# Patient Record
Sex: Male | Born: 1975 | Race: White | Hispanic: Yes | Smoking: Never smoker
Health system: Southern US, Community
[De-identification: ages and names within clinical notes are randomized; demographics above are authoritative.]

## PROBLEM LIST (undated history)

## (undated) DIAGNOSIS — K219 Gastro-esophageal reflux disease without esophagitis: Secondary | ICD-10-CM

## (undated) DIAGNOSIS — I10 Essential (primary) hypertension: Secondary | ICD-10-CM

## (undated) DIAGNOSIS — A048 Other specified bacterial intestinal infections: Secondary | ICD-10-CM

## (undated) HISTORY — DX: Other specified bacterial intestinal infections: A04.8

## (undated) HISTORY — DX: Essential (primary) hypertension: I10

## (undated) HISTORY — DX: Gastro-esophageal reflux disease without esophagitis: K21.9

---

## 2017-11-21 ENCOUNTER — Encounter (HOSPITAL_COMMUNITY): Payer: Self-pay | Admitting: Emergency Medicine

## 2017-11-21 ENCOUNTER — Emergency Department (HOSPITAL_COMMUNITY): Payer: Self-pay

## 2017-11-21 ENCOUNTER — Emergency Department (HOSPITAL_COMMUNITY)
Admission: EM | Admit: 2017-11-21 | Discharge: 2017-11-21 | Disposition: A | Discharge status: Home / Self Care | Payer: Self-pay | Attending: Emergency Medicine | Admitting: Emergency Medicine

## 2017-11-21 ENCOUNTER — Other Ambulatory Visit: Payer: Self-pay

## 2017-11-21 DIAGNOSIS — R0789 Other chest pain: Secondary | ICD-10-CM | POA: Insufficient documentation

## 2017-11-21 DIAGNOSIS — Y9289 Other specified places as the place of occurrence of the external cause: Secondary | ICD-10-CM | POA: Insufficient documentation

## 2017-11-21 DIAGNOSIS — Y9389 Activity, other specified: Secondary | ICD-10-CM | POA: Insufficient documentation

## 2017-11-21 DIAGNOSIS — X500XXA Overexertion from strenuous movement or load, initial encounter: Secondary | ICD-10-CM | POA: Insufficient documentation

## 2017-11-21 DIAGNOSIS — Y999 Unspecified external cause status: Secondary | ICD-10-CM | POA: Insufficient documentation

## 2017-11-21 DIAGNOSIS — M549 Dorsalgia, unspecified: Secondary | ICD-10-CM | POA: Insufficient documentation

## 2017-11-21 LAB — CBC
HCT: 46.6 % (ref 39.0–52.0)
Hemoglobin: 16.1 g/dL (ref 13.0–17.0)
MCH: 30 pg (ref 26.0–34.0)
MCHC: 34.5 g/dL (ref 30.0–36.0)
MCV: 86.9 fL (ref 78.0–100.0)
PLATELETS: 290 10*3/uL (ref 150–400)
RBC: 5.36 MIL/uL (ref 4.22–5.81)
RDW: 13.6 % (ref 11.5–15.5)
WBC: 4.7 10*3/uL (ref 4.0–10.5)

## 2017-11-21 LAB — BASIC METABOLIC PANEL
Anion gap: 10 (ref 5–15)
BUN: 10 mg/dL (ref 6–20)
CALCIUM: 9.3 mg/dL (ref 8.9–10.3)
CHLORIDE: 103 mmol/L (ref 101–111)
CO2: 25 mmol/L (ref 22–32)
CREATININE: 0.86 mg/dL (ref 0.61–1.24)
GFR calc non Af Amer: >60 mL/min (ref >60)
Glucose, Bld: 133 mg/dL — ABNORMAL HIGH (ref 65–99)
Potassium: 3.7 mmol/L (ref 3.5–5.1)
SODIUM: 138 mmol/L (ref 135–145)

## 2017-11-21 LAB — D-DIMER, QUANTITATIVE (NOT AT ARMC): D DIMER QUANT: 0.27 ug{FEU}/mL (ref 0.00–0.50)

## 2017-11-21 LAB — I-STAT TROPONIN, ED
TROPONIN I, POC: 0 ng/mL (ref 0.00–0.08)
Troponin i, poc: 0 ng/mL (ref 0.00–0.08)

## 2017-11-21 MED ORDER — KETOROLAC TROMETHAMINE 60 MG/2ML IM SOLN
60.0000 mg | Freq: Once | INTRAMUSCULAR | Status: AC
  Administered 2017-11-21: 60 mg via INTRAMUSCULAR
  Filled 2017-11-21: qty 2

## 2017-11-21 MED ORDER — IBUPROFEN 800 MG PO TABS: 800.0000 mg | ORAL_TABLET | Freq: Three times a day (TID) | ORAL | 0 refills | Status: DC | PRN

## 2017-11-21 NOTE — ED Triage Notes (Signed)
Pt. Stated, Ive had chest pain and ack pain for 2 weeks.

## 2017-11-21 NOTE — ED Notes (Signed)
Hooked patient up to the monitor patient is resting with call bell in reach and family at bedside 

## 2017-11-21 NOTE — ED Provider Notes (Signed)
[facility] EMERGENCY DEPARTMENT Provider Note   CSN: [id number] Arrival date & time: 11/21/17  1025     History   Chief Complaint Chief Complaint  Patient presents with  . Chest Pain  . Back Pain    HPI Raymond Olson is a 42 y.o. male.  HPI Patient presents to the emergency department with chest pain is been ongoing for [redacted] weeks along with back pain.  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  Patient states that currently take any medications.  Patient states he has had no recent surgeries or immobilizations.  No recent prolonged travel.  Patient states nothing seems to make the condition better but certain movements and palpation make the pain worse.  Patient states that sharp in nature.  He states that the pain does not seem to get relief with any Tylenol or the patient states it does seem to be better this week than it was last week however.  Patient states the pain does not completely go away at any point.  The patient denies  shortness of breath, headache,blurred vision, neck pain, fever, cough, weakness, numbness, dizziness, anorexia, edema, abdominal pain, nausea, vomiting, diarrhea, rash, back pain, dysuria, hematemesis, bloody stool, near syncope, or syncope. History reviewed. No pertinent past medical history.  There are no active problems to display for this patient.   History reviewed. No pertinent surgical history.      Home Medications    Prior to Admission medications   Medication Sig Start Date End Date Taking? Authorizing Provider  loratadine (CLARITIN) 10 MG tablet Take 10 mg by mouth daily as needed for allergies.   Yes [provider]    Family History No family history on file.  Social History Social History   Tobacco Use  . Smoking status: Never  Smoker  . Smokeless tobacco: Never Used  Substance Use Topics  . Alcohol use: Yes  . Drug use: Not Currently     Allergies   Patient has no allergy information on record.   Review of Systems Review of Systems All other systems negative except as documented in the HPI. All pertinent positives and negatives as reviewed in the HPI.  Physical Exam Updated Vital Signs BP 118/82   Pulse (!) 52   Temp 98.7 F (37.1 C) (Oral)   Resp (!) 8   Ht  (1.651 m)   Wt 72.6 kg (160 lb)   SpO2 96%   BMI 26.63 kg/m   Physical Exam  Constitutional: He is oriented to person, place, and time. He appears well-developed and well-nourished. No distress.  HENT:  Head: Normocephalic and atraumatic.  Mouth/Throat: Oropharynx is clear and moist.  Eyes: Pupils are equal, round, and reactive to light.  Neck: Normal range of motion. Neck supple.  Cardiovascular: Normal rate, regular rhythm and normal heart sounds. Exam reveals no gallop and no friction rub.  No murmur heard. Pulmonary/Chest: Effort normal and breath sounds normal. No accessory muscle usage. No respiratory distress. He has no decreased breath sounds. He has no wheezes. He has no rhonchi. He has no rales.  Abdominal: Soft. Bowel sounds are normal. He exhibits no distension. There is no tenderness.  Neurological: He is alert and oriented to person, place, and time. He exhibits normal muscle tone. Coordination normal.  Skin: Skin  is warm and dry. Capillary refill takes less than 2 seconds. No rash noted. No erythema.  Psychiatric: He has a normal mood and affect. His behavior is normal.  Nursing note and vitals reviewed.    ED Treatments / Results  Labs (all labs ordered are listed, but only abnormal results are displayed) Labs Reviewed  BASIC METABOLIC PANEL - Abnormal; Notable for the following components:      Result Value   Glucose, Bld 133 (*)    All other components within normal limits  CBC  D-DIMER, QUANTITATIVE (NOT  AT [facility])  I-STAT TROPONIN, ED  I-STAT TROPONIN, ED    EKG EKG Interpretation  Date/Time:  Saturday Nov 21 2017 10:30:17 EDT Ventricular Rate:  68 PR Interval:  156 QRS Duration: 90 QT Interval:  398 QTC Calculation: 423 R Axis:   102 Text Interpretation:  Normal sinus rhythm with sinus arrhythmia Rightward axis nonspecific t waves inf and ant no prior to compare with Confirmed by Meridee Score [phone]) on 11/21/2017 12:21:11 PM   Radiology Dg Chest 2 View  Result Date: 11/21/2017 CLINICAL DATA:  Chest pain EXAM: CHEST - 2 VIEW COMPARISON:  None. FINDINGS: Normal heart size. Normal mediastinal contour. No pneumothorax. No pleural effusion. Lungs appear clear, with no acute consolidative airspace disease and no pulmonary edema. IMPRESSION: No active cardiopulmonary disease. Electronically Signed   By: Delbert Phenix M.D.   On: 11/21/2017 10:55    Procedures Procedures (including critical care time)  Medications Ordered in ED Medications  ketorolac (TORADOL) injection 60 mg (60 mg Intramuscular Given 11/21/17 1348)     Initial Impression / Assessment and Plan / ED Course  I have reviewed the triage vital signs and the nursing notes.  Pertinent labs & imaging results that were available during my care of the patient were reviewed by me and considered in my medical decision making (see chart for details).     Patient has atypical chest pain that has been constant for the last 2 weeks.  Patient does not have any exertional symptoms.  This pain was reproducible as well.  Advised the patient that he will need to follow-up with her primary doctor.  I advised him to return here for any worsening in his condition.  Final Clinical Impressions(s) / ED Diagnoses   Final diagnoses:  None    ED Discharge Orders    None       Charlestine Night, PA-C 11/21/17 1507    Terrilee Files, MD 11/23/17 [phone]

## 2017-11-21 NOTE — Discharge Instructions (Signed)
Your heart testing today did not show any abnormality.  We did do a test that showed no signs of blood clot.  I would like for you to follow-up with the clinic provided.

## 2019-07-30 ENCOUNTER — Emergency Department (HOSPITAL_COMMUNITY): Payer: Self-pay

## 2019-07-30 ENCOUNTER — Other Ambulatory Visit: Payer: Self-pay

## 2019-07-30 ENCOUNTER — Encounter (HOSPITAL_COMMUNITY): Payer: Self-pay | Admitting: Emergency Medicine

## 2019-07-30 ENCOUNTER — Emergency Department (HOSPITAL_COMMUNITY)
Admission: EM | Admit: 2019-07-30 | Discharge: 2019-07-30 | Disposition: A | Discharge status: Home / Self Care | Payer: Self-pay | Attending: Emergency Medicine | Admitting: Emergency Medicine

## 2019-07-30 DIAGNOSIS — R1084 Generalized abdominal pain: Secondary | ICD-10-CM | POA: Insufficient documentation

## 2019-07-30 DIAGNOSIS — R309 Painful micturition, unspecified: Secondary | ICD-10-CM | POA: Insufficient documentation

## 2019-07-30 DIAGNOSIS — R109 Unspecified abdominal pain: Secondary | ICD-10-CM

## 2019-07-30 DIAGNOSIS — K6289 Other specified diseases of anus and rectum: Secondary | ICD-10-CM | POA: Insufficient documentation

## 2019-07-30 DIAGNOSIS — K5900 Constipation, unspecified: Secondary | ICD-10-CM | POA: Insufficient documentation

## 2019-07-30 LAB — CBC
HCT: 50.2 % (ref 39.0–52.0)
Hemoglobin: 17.1 g/dL — ABNORMAL HIGH (ref 13.0–17.0)
MCH: 29.2 pg (ref 26.0–34.0)
MCHC: 34.1 g/dL (ref 30.0–36.0)
MCV: 85.7 fL (ref 80.0–100.0)
Platelets: 313 10*3/uL (ref 150–400)
RBC: 5.86 MIL/uL — ABNORMAL HIGH (ref 4.22–5.81)
RDW: 13.1 % (ref 11.5–15.5)
WBC: 6.4 10*3/uL (ref 4.0–10.5)
nRBC: 0 % (ref 0.0–0.2)

## 2019-07-30 LAB — COMPREHENSIVE METABOLIC PANEL
ALT: 81 U/L — ABNORMAL HIGH (ref 0–44)
AST: 38 U/L (ref 15–41)
Albumin: 4.6 g/dL (ref 3.5–5.0)
Alkaline Phosphatase: 60 U/L (ref 38–126)
Anion gap: 10 (ref 5–15)
BUN: 14 mg/dL (ref 6–20)
CO2: 29 mmol/L (ref 22–32)
Calcium: 9.5 mg/dL (ref 8.9–10.3)
Chloride: 102 mmol/L (ref 98–111)
Creatinine, Ser: 0.77 mg/dL (ref 0.61–1.24)
GFR calc Af Amer: >60 mL/min (ref >60)
GFR calc non Af Amer: >60 mL/min (ref >60)
Glucose, Bld: 110 mg/dL — ABNORMAL HIGH (ref 70–99)
Potassium: 4 mmol/L (ref 3.5–5.1)
Sodium: 141 mmol/L (ref 135–145)
Total Bilirubin: 1.4 mg/dL — ABNORMAL HIGH (ref 0.3–1.2)
Total Protein: 7.8 g/dL (ref 6.5–8.1)

## 2019-07-30 LAB — LIPASE, BLOOD: Lipase: 26 U/L (ref 11–51)

## 2019-07-30 LAB — URINALYSIS, ROUTINE W REFLEX MICROSCOPIC
Bilirubin Urine: NEGATIVE
Glucose, UA: NEGATIVE mg/dL
Hgb urine dipstick: NEGATIVE
Ketones, ur: NEGATIVE mg/dL
Leukocytes,Ua: NEGATIVE
Nitrite: NEGATIVE
Protein, ur: NEGATIVE mg/dL
Specific Gravity, Urine: 1.019 (ref 1.005–1.030)
pH: 7 (ref 5.0–8.0)

## 2019-07-30 MED ORDER — SODIUM CHLORIDE 0.9% FLUSH: 3.0000 mL | Freq: Once | INTRAVENOUS | Status: DC

## 2019-07-30 MED ORDER — IOHEXOL 300 MG/ML  SOLN
100.0000 mL | Freq: Once | INTRAMUSCULAR | Status: AC | PRN
  Administered 2019-07-30: 20:00:00 100 mL via INTRAVENOUS

## 2019-07-30 NOTE — ED Notes (Signed)
Patient transported to CT 

## 2019-07-30 NOTE — ED Provider Notes (Signed)
[facility] EMERGENCY DEPARTMENT Provider Note   CSN: [id number] Arrival date & time: 07/30/19  1604     History Chief Complaint  Patient presents with  . Abdominal Pain    Raymond Olson is a 44 y.o. male.  44yo M w/ PMH including H pylori who p/w abdominal pain. Pt states that he has had generalized, constant abdominal pain for ~2 weeks that seems to be worse when moving around but is not affected by eating.  He denies any associated vomiting or diarrhea.  He reports that he does have problems with constipation.  He states that over the past 3 to 4 days, he has been having some burning anal pain. It does not seem to be related to bowel movements. He endorses some burning with urination recently.   Patient notes that approximately 5 months ago, he was seen by his PCP for some abdominal pain issues.  He subsequently tested positive for H. pylori and was given medication treatment for it.  He reports that he has not had problems since then and reflux symptoms have been under control.  The history is provided by the patient.  Abdominal Pain      History reviewed. No pertinent past medical history.  There are no problems to display for this patient.   History reviewed. No pertinent surgical history.     No family history on file.  Social History   Tobacco Use  . Smoking status: Never Smoker  . Smokeless tobacco: Never Used  Substance Use Topics  . Alcohol use: Yes  . Drug use: Not Currently    Home Medications Prior to Admission medications   Medication Sig Start Date End Date Taking? Authorizing Provider  ibuprofen (ADVIL,MOTRIN) 800 MG tablet Take 1 tablet (800 mg total) by mouth every 8 (eight) hours as needed. 11/21/17   Lawyer, Cristal Deer, PA-C  loratadine (CLARITIN) 10 MG tablet Take 10 mg by mouth daily as needed for allergies.    [provider]    Allergies    Patient has no allergy information on record.  Review of  Systems   Review of Systems  Gastrointestinal: Positive for abdominal pain.   All other systems reviewed and are negative except that which was mentioned in HPI  Physical Exam Updated Vital Signs BP (!) 148/86   Pulse 70   Temp 98.2 F (36.8 C) (Oral)   Resp 16   SpO2 99%   Physical Exam Vitals and nursing note reviewed.  Constitutional:      General: He is not in acute distress.    Appearance: He is well-developed.  HENT:     Head: Normocephalic and atraumatic.  Eyes:     Conjunctiva/sclera: Conjunctivae normal.  Cardiovascular:     Rate and Rhythm: Normal rate and regular rhythm.     Heart sounds: Normal heart sounds. No murmur.  Pulmonary:     Effort: Pulmonary effort is normal.     Breath sounds: Normal breath sounds.  Abdominal:     General: Bowel sounds are normal. There is no distension.     Palpations: Abdomen is soft.     Tenderness: There is abdominal tenderness in the suprapubic area and left lower quadrant.  Musculoskeletal:     Cervical back: Neck supple.  Skin:    General: Skin is warm and dry.  Neurological:     Mental Status: He is alert and oriented to person, place, and time.     Comments: Fluent speech  Psychiatric:  Judgment: Judgment normal.     ED Results / Procedures / Treatments   Labs (all labs ordered are listed, but only abnormal results are displayed) Labs Reviewed  COMPREHENSIVE METABOLIC PANEL - Abnormal; Notable for the following components:      Result Value   Glucose, Bld 110 (*)    ALT 81 (*)    Total Bilirubin 1.4 (*)    All other components within normal limits  CBC - Abnormal; Notable for the following components:   RBC 5.86 (*)    Hemoglobin 17.1 (*)    All other components within normal limits  URINALYSIS, ROUTINE W REFLEX MICROSCOPIC - Abnormal; Notable for the following components:   APPearance HAZY (*)    All other components within normal limits  LIPASE, BLOOD    EKG None  Radiology CT Abdomen  Pelvis W Contrast  Result Date: 07/30/2019 CLINICAL DATA:  Left lower quadrant suprapubic pain EXAM: CT ABDOMEN AND PELVIS WITH CONTRAST TECHNIQUE: Multidetector CT imaging of the abdomen and pelvis was performed using the standard protocol following bolus administration of intravenous contrast. CONTRAST:  OMNIPAQUE IOHEXOL 300 MG/ML  SOLN COMPARISON:  None. FINDINGS: Lower chest: The visualized heart size within normal limits. No pericardial fluid/thickening. No hiatal hernia. The visualized portions of the lungs are clear. Hepatobiliary: There is diffuse low density seen throughout the liver parenchyma. No focal hepatic lesion is seen. No intra or extrahepatic biliary ductal dilatation. The main portal vein is patent. No evidence of calcified gallstones, gallbladder wall thickening or biliary dilatation. Pancreas: Unremarkable. No pancreatic ductal dilatation or surrounding inflammatory changes. Spleen: Normal in size without focal abnormality. Adrenals/Urinary Tract: Both adrenal glands appear normal. Small 4 mm renal calculus seen in the lower pole the right kidney. No hydronephrosis. No left-sided renal calculi seen. Bladder is unremarkable. Stomach/Bowel: The stomach, small bowel, and colon are normal in appearance. No inflammatory changes, wall thickening, or obstructive findings.Scattered colonic diverticula are noted without diverticulitis. The appendix is normal in appearance. Vascular/Lymphatic: There are no enlarged mesenteric, retroperitoneal, or pelvic lymph nodes. No significant vascular findings are present. Reproductive: The prostate is unremarkable. Other: No evidence of abdominal wall mass or hernia. Musculoskeletal: No acute or significant osseous findings. IMPRESSION: 1. Hepatic steatosis. 2. Diverticulosis without diverticulitis. 3. Nonobstructing 4 mm right renal calculi. Electronically Signed   By: Jonna Clark M.D.   On: 07/30/2019 20:27    Procedures Procedures (including  critical care time)  Medications Ordered in ED Medications  sodium chloride flush (NS) 0.9 % injection 3 mL (has no administration in time range)  iohexol (OMNIPAQUE) 300 MG/ML solution 100 mL (100 mLs Intravenous Contrast Given 07/30/19 2009)    ED Course  I have reviewed the triage vital signs and the nursing notes.  Pertinent labs & imaging results that were available during my care of the patient were reviewed by me and considered in my medical decision making (see chart for details).    MDM Rules/Calculators/A&P                      Pt well appearing and afebrile. LLQ and suprapubic tenderness on exam.  Given location of pain and no associated symptoms, I feel that gallbladder pathology is less likely.  I also feel his symptoms are not consistent with appendicitis.  Given report of constipation and persistent abdominal pain, differential includes diverticulitis therefore obtain CT of abdomen pelvis.  CT negative acute, shows diverticulosis without diverticulitis.  Lab work including CMP, CBC,  lipase is unremarkable.  Normal WBC count.  UA normal.  Given the fact that patient has been eating and drinking normally and has had no vomiting, diarrhea, or fevers, I feel he is safe for discharge with outpatient follow-up.  He states he can follow-up with his primary care clinic and I have encouraged him to schedule next available appointment as he may need referral to GI if symptoms continue or worsen.  I have reviewed return precautions and patient voiced understanding. Final Clinical Impression(s) / ED Diagnoses Final diagnoses:  Abdominal pain, unspecified abdominal location    Rx / DC Orders ED Discharge Orders    None       Lakeisa Heninger, Wenda Overland, MD 07/30/19 2106

## 2019-07-30 NOTE — ED Notes (Signed)
Patient verbalizes understanding of discharge instructions. Opportunity for questioning and answers were provided. Armband removed by staff, pt discharged from ED.  

## 2019-07-30 NOTE — ED Triage Notes (Signed)
C/o generalized abd pain and anal pain x 2 weeks.  Denies nausea, vomiting, and diarrhea.

## 2019-10-21 ENCOUNTER — Ambulatory Visit: Payer: Self-pay | Attending: Internal Medicine

## 2019-10-21 DIAGNOSIS — Z23 Encounter for immunization: Secondary | ICD-10-CM

## 2019-10-21 NOTE — Progress Notes (Signed)
   Covid-19 Vaccination Clinic  Name:  Raymond Olson    MRN: [id number] DOB: October 24, 1975  10/21/2019  [name] was observed post Covid-19 immunization for 15 minutes without incident. He was provided with Vaccine Information Sheet and instruction to access the V-Safe system.   Mr. Needs was instructed to call 911 with any severe reactions post vaccine: Marland Kitchen Difficulty breathing  . Swelling of face and throat  . A fast heartbeat  . A bad rash all over body  . Dizziness and weakness   Immunizations Administered    Name Date Dose VIS Date Route   Pfizer COVID-19 Vaccine 10/21/2019  2:50 PM 0.3 mL 07/01/2019 Intramuscular   Manufacturer: [employer], [employer]   Lot: [id number]   NDC: [address]-1

## 2019-11-14 ENCOUNTER — Ambulatory Visit: Payer: Self-pay | Attending: Internal Medicine

## 2019-11-14 DIAGNOSIS — Z23 Encounter for immunization: Secondary | ICD-10-CM

## 2019-11-14 NOTE — Progress Notes (Signed)
   Covid-19 Vaccination Clinic  Name:  Raymond Olson    MRN: [id number] DOB: 1976/06/09  11/14/2019  [name] was observed post Covid-19 immunization for 15 minutes without incident. He was provided with Vaccine Information Sheet and instruction to access the V-Safe system.   [name] was instructed to call 911 with any severe reactions post vaccine: Marland Kitchen Difficulty breathing  . Swelling of face and throat  . A fast heartbeat  . A bad rash all over body  . Dizziness and weakness   Immunizations Administered    Name Date Dose VIS Date Route   Pfizer COVID-19 Vaccine 11/14/2019  2:58 PM 0.3 mL 09/14/2018 Intramuscular   Manufacturer: [employer], [employer]   Lot: [id number]   NDC: [address]-9

## 2021-04-06 IMAGING — CT CT ABD-PELV W/ CM
2 of 5 series · 16 of 46 positions shown, 18 images · IV contrast (APPLIED)
Comparison: None.

CLINICAL DATA: Left lower quadrant suprapubic pain

EXAM:
CT ABDOMEN AND PELVIS WITH CONTRAST
TECHNIQUE: Multidetector CT imaging of the abdomen and pelvis was performed
using the standard protocol following bolus administration of
intravenous contrast.
CONTRAST:  100mL OMNIPAQUE IOHEXOL 300 MG/ML  SOLN

[Series 3: abd/ pelvis 5.0 i30f 2 · axial · 0.80mm/px · z∈[+903,+1323]mm · 13 of 96 slices shown, 15 images]
[im 6/96  soft-tissue]
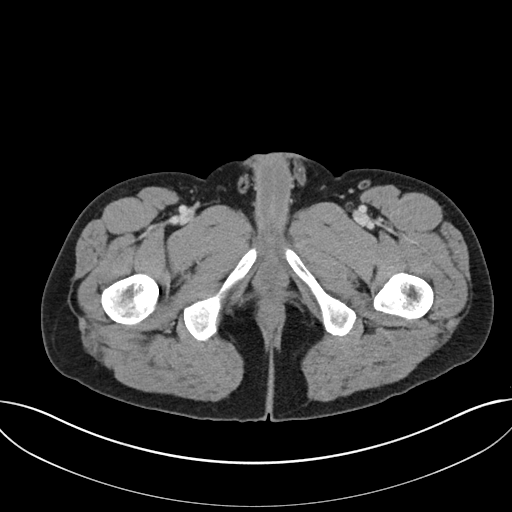
[im 6/96  bone]
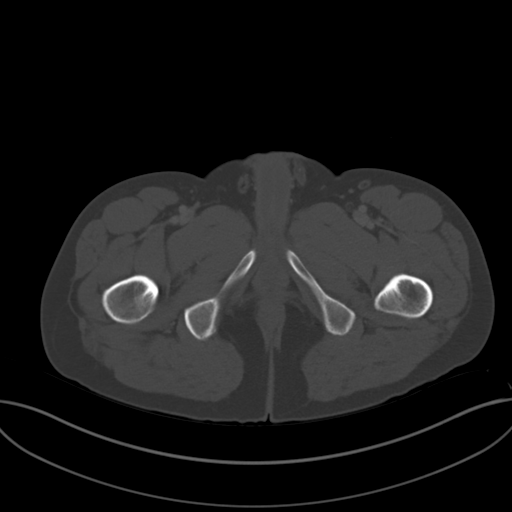
[im 12/96  soft-tissue]
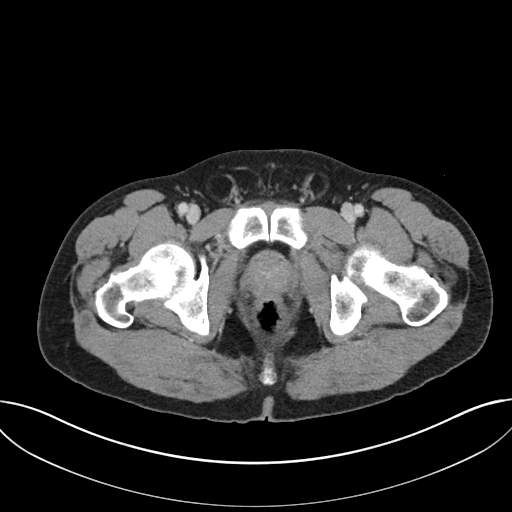
[im 23/96  soft-tissue]
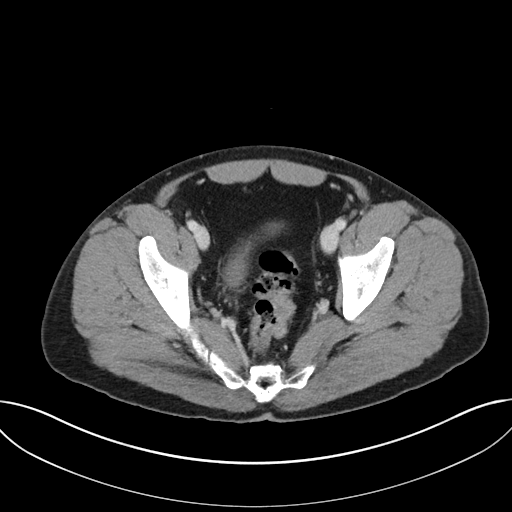
[im 28/96  soft-tissue]
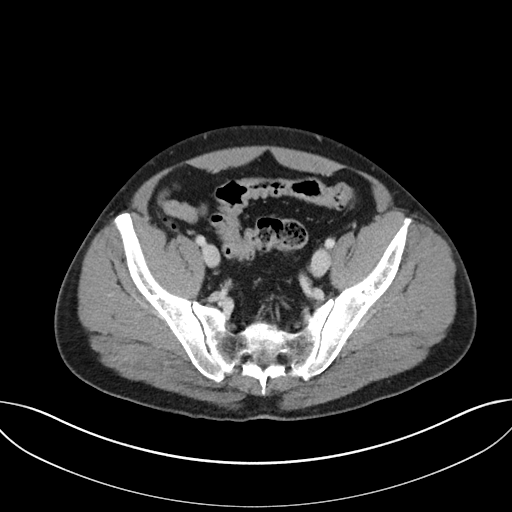
[im 34/96  soft-tissue]
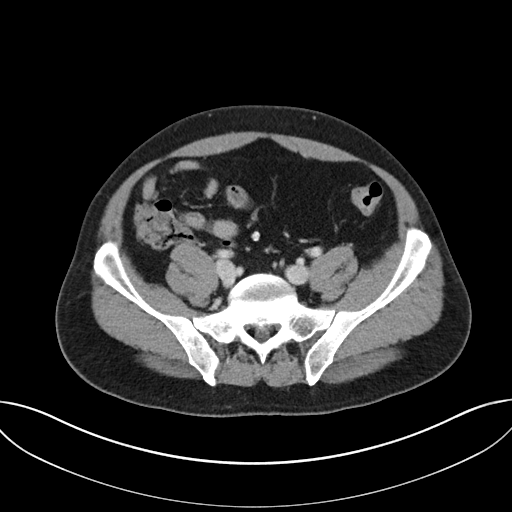
[im 40/96  soft-tissue]
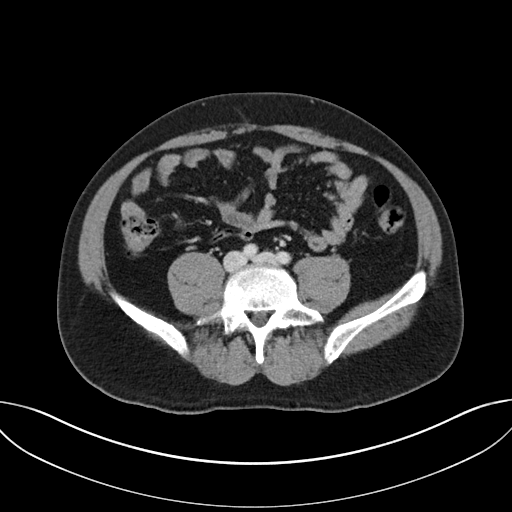
[im 51/96  soft-tissue]
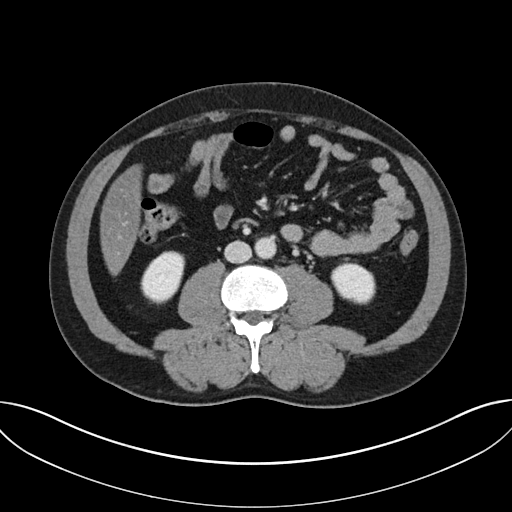
[im 56/96  soft-tissue]
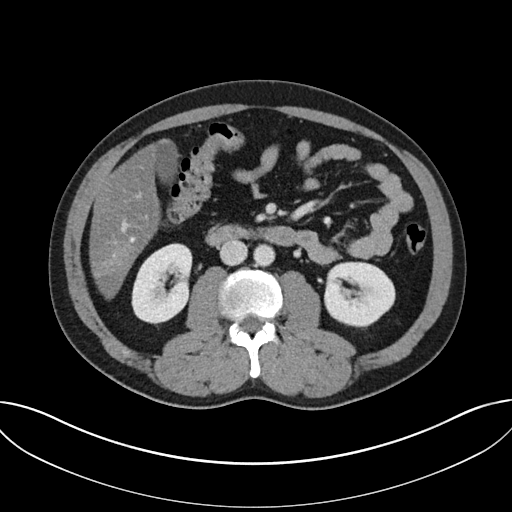
[im 62/96  soft-tissue]
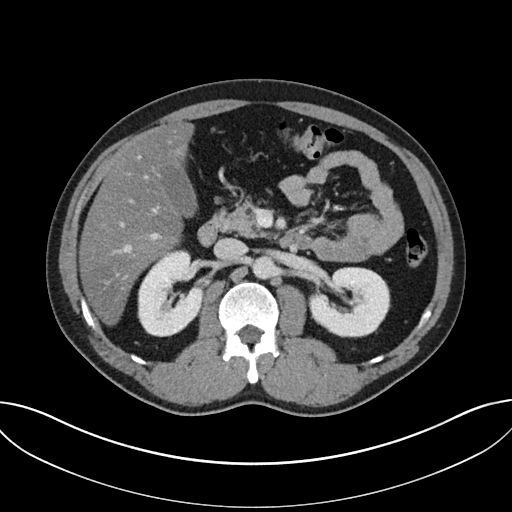
[im 62/96  bone]
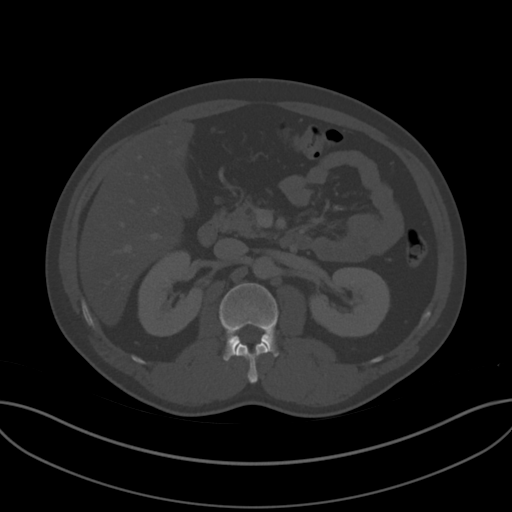
[im 68/96  soft-tissue]
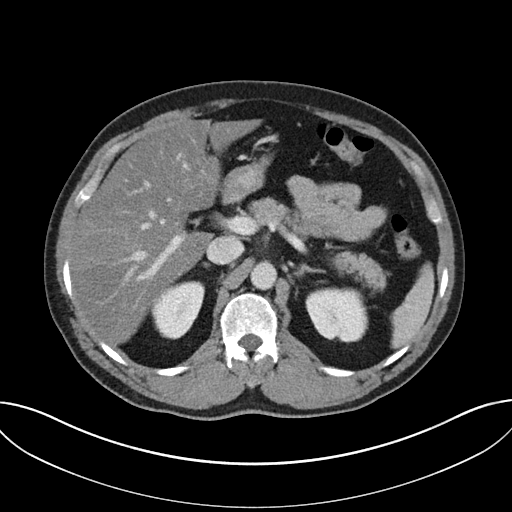
[im 73/96  soft-tissue]
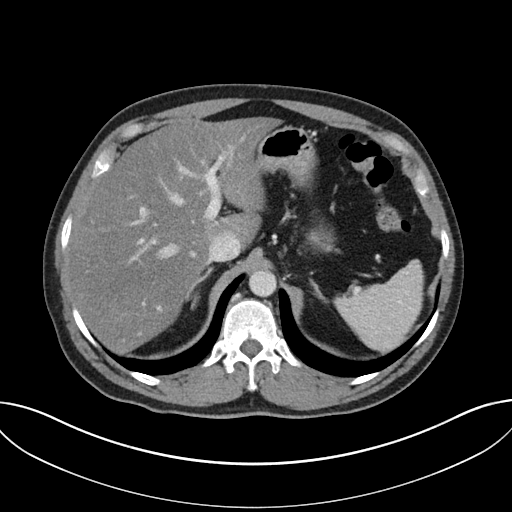
[im 84/96  soft-tissue]
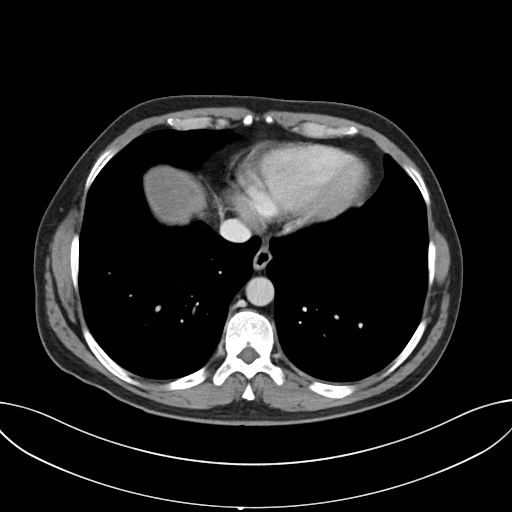
[im 90/96  soft-tissue]
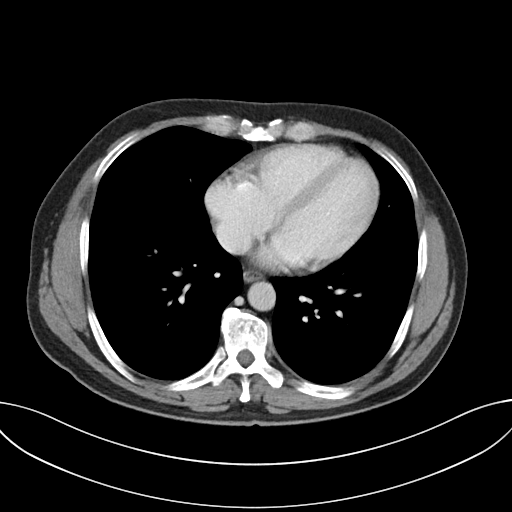

[Series 6: coronal soft tissue · coronal · 0.73mm/px · 3 of 100 slices shown]
[im 34/100  soft-tissue]
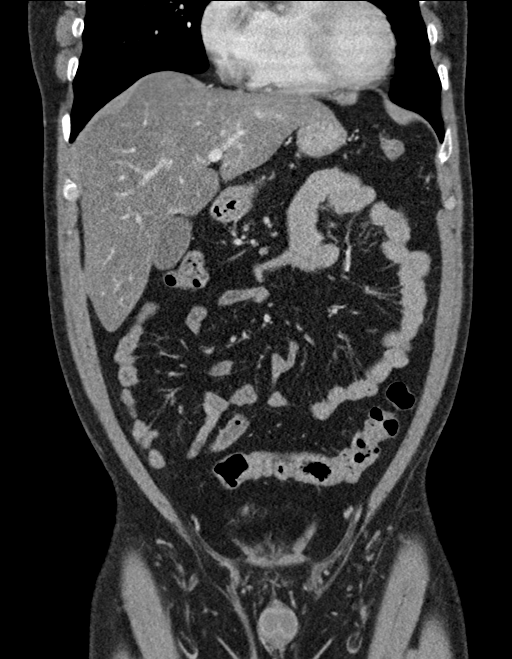
[im 45/100  soft-tissue]
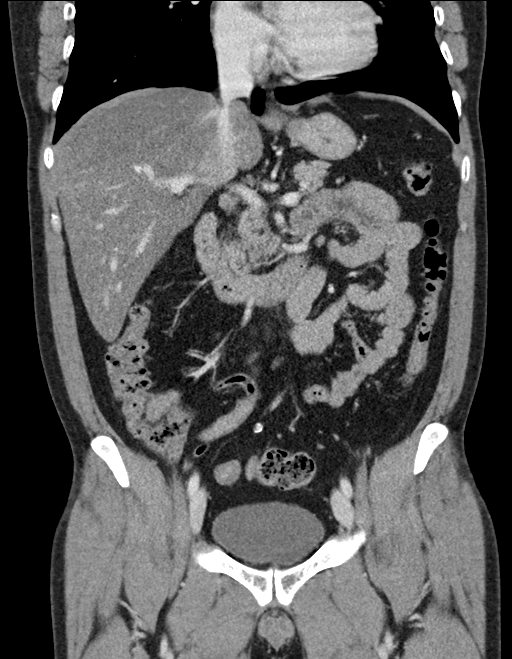
[im 56/100  soft-tissue]
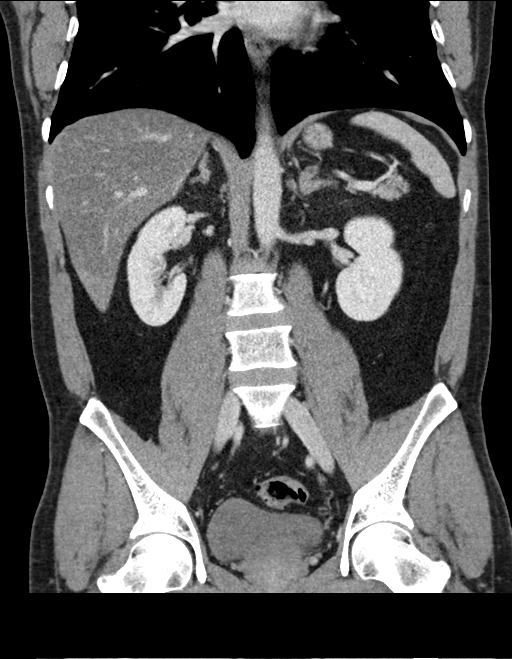

[16 of 46 positions shown; findings below may reference images not displayed]

FINDINGS: Lower chest: The visualized heart size within normal limits. No
pericardial fluid/thickening.

No hiatal hernia.

The visualized portions of the lungs are clear.

Hepatobiliary: There is diffuse low density seen throughout the
liver parenchyma. No focal hepatic lesion is seen. No intra or
extrahepatic biliary ductal dilatation. The main portal vein is
patent. No evidence of calcified gallstones, gallbladder wall
thickening or biliary dilatation.

Pancreas: Unremarkable. No pancreatic ductal dilatation or
surrounding inflammatory changes.

Spleen: Normal in size without focal abnormality.

Adrenals/Urinary Tract: Both adrenal glands appear normal. Small 4
mm renal calculus seen in the lower pole the right kidney. No
hydronephrosis. No left-sided renal calculi seen. Bladder is
unremarkable.

Stomach/Bowel: The stomach, small bowel, and colon are normal in
appearance. No inflammatory changes, wall thickening, or obstructive
findings.Scattered colonic diverticula are noted without
diverticulitis. The appendix is normal in appearance.

Vascular/Lymphatic: There are no enlarged mesenteric,
retroperitoneal, or pelvic lymph nodes. No significant vascular
findings are present.

Reproductive: The prostate is unremarkable.

Other: No evidence of abdominal wall mass or hernia.

Musculoskeletal: No acute or significant osseous findings.
IMPRESSION: 1. Hepatic steatosis.
2. Diverticulosis without diverticulitis.
3. Nonobstructing 4 mm right renal calculi.

## 2022-08-08 ENCOUNTER — Encounter: Payer: Self-pay | Admitting: Gastroenterology

## 2022-09-03 ENCOUNTER — Encounter: Payer: Self-pay | Admitting: Gastroenterology

## 2022-09-03 ENCOUNTER — Ambulatory Visit: Payer: Self-pay | Admitting: Gastroenterology

## 2022-09-03 ENCOUNTER — Other Ambulatory Visit (INDEPENDENT_AMBULATORY_CARE_PROVIDER_SITE_OTHER): Payer: Self-pay

## 2022-09-03 VITALS — BP 138/78 | HR 68 | Ht 65.0 in | Wt 173.0 lb

## 2022-09-03 DIAGNOSIS — R1319 Other dysphagia: Secondary | ICD-10-CM

## 2022-09-03 DIAGNOSIS — R194 Change in bowel habit: Secondary | ICD-10-CM

## 2022-09-03 DIAGNOSIS — R1010 Upper abdominal pain, unspecified: Secondary | ICD-10-CM

## 2022-09-03 DIAGNOSIS — R140 Abdominal distension (gaseous): Secondary | ICD-10-CM

## 2022-09-03 DIAGNOSIS — Z8619 Personal history of other infectious and parasitic diseases: Secondary | ICD-10-CM

## 2022-09-03 LAB — CBC WITH DIFFERENTIAL/PLATELET
Basophils Absolute: 0.1 10*3/uL (ref 0.0–0.1)
Basophils Relative: 1 % (ref 0.0–3.0)
Eosinophils Absolute: 0.1 10*3/uL (ref 0.0–0.7)
Eosinophils Relative: 1.3 % (ref 0.0–5.0)
HCT: 48.6 % (ref 39.0–52.0)
Hemoglobin: 16.7 g/dL (ref 13.0–17.0)
Lymphocytes Relative: 34.8 % (ref 12.0–46.0)
Lymphs Abs: 1.7 10*3/uL (ref 0.7–4.0)
MCHC: 34.3 g/dL (ref 30.0–36.0)
MCV: 86.2 fl (ref 78.0–100.0)
Monocytes Absolute: 0.4 10*3/uL (ref 0.1–1.0)
Monocytes Relative: 7.7 % (ref 3.0–12.0)
Neutro Abs: 2.8 10*3/uL (ref 1.4–7.7)
Neutrophils Relative %: 55.2 % (ref 43.0–77.0)
Platelets: 301 10*3/uL (ref 150.0–400.0)
RBC: 5.64 Mil/uL (ref 4.22–5.81)
RDW: 13.7 % (ref 11.5–15.5)
WBC: 5 10*3/uL (ref 4.0–10.5)

## 2022-09-03 LAB — COMPREHENSIVE METABOLIC PANEL
ALT: 36 U/L (ref 0–53)
AST: 23 U/L (ref 0–37)
Albumin: 4.7 g/dL (ref 3.5–5.2)
Alkaline Phosphatase: 73 U/L (ref 39–117)
BUN: 16 mg/dL (ref 6–23)
CO2: 28 mEq/L (ref 19–32)
Calcium: 9.7 mg/dL (ref 8.4–10.5)
Chloride: 102 mEq/L (ref 96–112)
Creatinine, Ser: 0.84 mg/dL (ref 0.40–1.50)
GFR: 105.22 mL/min (ref >60.00)
Glucose, Bld: 125 mg/dL — ABNORMAL HIGH (ref 70–99)
Potassium: 4 mEq/L (ref 3.5–5.1)
Sodium: 139 mEq/L (ref 135–145)
Total Bilirubin: 0.8 mg/dL (ref 0.2–1.2)
Total Protein: 7.8 g/dL (ref 6.0–8.3)

## 2022-09-03 NOTE — Progress Notes (Signed)
[facility] Consult Note:  History: Raymond Olson 09/03/2022  Referring provider: Brantley Stage, PA  Reason for consult/chief complaint: Abdominal Pain (Pt is having abd pain, pt been having pain for several years) and Constipation (Pt is having intermittent constipation and diarrhea)   Subjective  HPI: Raymond Olson is a 47 year old man referred by primary care for evaluation of "GERD".  Referral notes indicate patient has had several years of dyspepsia, left upper quadrant pain and bloating, prior H. pylori treatment.  Persistent symptoms despite PPI and H2 blocker.  He states that he feels upper abdominal bloating/discomfort and LUQ stabbing pain. The pain has been intermittent x1 year but has become constant in the last x1 month. He was seen in the ED in 07/2019 for similar pain. He had a CT AP done at that time which was unremarkable for any acute concerns.  He has been taking Omeprazole daily x2 years with good control of his burning heartburn discomfort.  He reports having x2-3 normal bowel movements a day. He sometimes feels as if he needs to use the bathroom but cannot. He has tried Miralax in the past without relief. He reports occasionally feeling as if he has difficulty getting his food down. He denies any bloody stools or rectal bleeding. He denies any nausea, vomiting, decreased appetite, or unintentional weight loss. He denies any leg swelling, rashes, chest pain, or shortness of breath.  Patient denies any known cardiac, kidney, or liver problems. He had a cousin who was passed away from stomach cancer at age 1.  He works in housekeeping.   ROS:  Review of Systems  Constitutional:  Negative for appetite change and fever.  HENT:  Negative for trouble swallowing.   Respiratory:  Negative for cough and shortness of breath.   Cardiovascular:  Negative for chest pain.  Gastrointestinal:  Positive for abdominal distention, abdominal pain and  constipation. Negative for anal bleeding, blood in stool, diarrhea, nausea, rectal pain and vomiting.  Genitourinary:  Negative for dysuria.  Musculoskeletal:  Negative for back pain.  Skin:  Negative for rash.  Neurological:  Negative for weakness.  All other systems reviewed and are negative.    Past Medical History: Past Medical History:  Diagnosis Date   Acid reflux    H. pylori infection    Hypertension      Past Surgical History: History reviewed. No pertinent surgical history.   Family History: Family History  Problem Relation Age of Onset   Stroke Father        brain   Heart disease Maternal Grandmother    Stomach cancer Cousin 48   Esophageal cancer Neg Hx    Colon cancer Neg Hx    Note above: A cousin died from colon cancer in her early 42s. Social History: Social History   Socioeconomic History   Marital status: Married    Spouse name: Not on file   Number of children: 0   Years of education: Not on file   Highest education level: Not on file  Occupational History   Occupation: Chartered certified accountant  Tobacco Use   Smoking status: Never   Smokeless tobacco: Never  Vaping Use   Vaping Use: Never used  Substance and Sexual Activity   Alcohol use: Yes    Comment: 2-3 beers on weekend   Drug use: Not Currently   Sexual activity: Not on file  Other Topics Concern   Not on file  Social History Narrative   Not on file  Social Determinants of Health   Financial Resource Strain: Not on file  Food Insecurity: Not on file  Transportation Needs: Not on file  Physical Activity: Not on file  Stress: Not on file  Social Connections: Not on file    Allergies: Not on File  Outpatient Meds: Current Outpatient Medications  Medication Sig Dispense Refill   ibuprofen (ADVIL,MOTRIN) 800 MG tablet Take 1 tablet (800 mg total) by mouth every 8 (eight) hours as needed. (Patient not taking: Reported on 09/03/2022) 21 tablet 0   loratadine (CLARITIN) 10 MG tablet  Take 10 mg by mouth daily as needed for allergies. (Patient not taking: Reported on 09/03/2022)     No current facility-administered medications for this visit.      ___________________________________________________________________ Objective   Exam:  BP 138/78   Pulse 68   Ht 5' 5"$  (1.651 m)   Wt 173 lb (78.5 kg)   BMI 28.79 kg/m  Wt Readings from Last 3 Encounters:  09/03/22 173 lb (78.5 kg)  11/21/17 160 lb (72.6 kg)    General: Well-appearing   Eyes: sclera anicteric, no redness ENT: oral mucosa moist without lesions, no cervical or supraclavicular lymphadenopathy CV: RRR, no JVD, no peripheral edema, no bruit Resp: clear to auscultation bilaterally, normal RR and effort noted GI: soft, no tenderness, with active bowel sounds. No guarding or palpable organomegaly noted. Skin; warm and dry, no rash or jaundice noted Neuro: awake, alert and oriented x 3. Normal gross motor function and fluent speech  Labs:  None recent for review, not included with referral     Latest Ref Rng & Units 07/30/2019    4:29 PM 11/21/2017   10:36 AM  CBC  WBC 4.0 - 10.5 K/uL 6.4  4.7   Hemoglobin 13.0 - 17.0 g/dL 17.1  16.1   Hematocrit 39.0 - 52.0 % 50.2  46.6   Platelets 150 - 400 K/uL 313  290       Latest Ref Rng & Units 07/30/2019    4:29 PM 11/21/2017   10:36 AM  CMP  Glucose 70 - 99 mg/dL 110  133   BUN 6 - 20 mg/dL 14  10   Creatinine 0.61 - 1.24 mg/dL 0.77  0.86   Sodium 135 - 145 mmol/L 141  138   Potassium 3.5 - 5.1 mmol/L 4.0  3.7   Chloride 98 - 111 mmol/L 102  103   CO2 22 - 32 mmol/L 29  25   Calcium 8.9 - 10.3 mg/dL 9.5  9.3   Total Protein 6.5 - 8.1 g/dL 7.8    Total Bilirubin 0.3 - 1.2 mg/dL 1.4    Alkaline Phos 38 - 126 U/L 60    AST 15 - 41 U/L 38    ALT 0 - 44 U/L 81       Radiologic Studies: Patient had an ED visit 3 years ago for an exacerbation of this pain with imaging below  CT Abdomen Pelvis W Contrast Result Date: 07/30/2019 CLINICAL DATA:  Left  lower quadrant suprapubic pain EXAM: CT ABDOMEN AND PELVIS WITH CONTRAST TECHNIQUE: Multidetector CT imaging of the abdomen and pelvis was performed using the standard protocol following bolus administration of intravenous contrast. CONTRAST:  138m OMNIPAQUE IOHEXOL 300 MG/ML  SOLN COMPARISON:  None. FINDINGS: Lower chest: The visualized heart size within normal limits. No pericardial fluid/thickening. No hiatal hernia. The visualized portions of the lungs are clear. Hepatobiliary: There is diffuse low density seen throughout the liver parenchyma. No focal  hepatic lesion is seen. No intra or extrahepatic biliary ductal dilatation. The main portal vein is patent. No evidence of calcified gallstones, gallbladder wall thickening or biliary dilatation. Pancreas: Unremarkable. No pancreatic ductal dilatation or surrounding inflammatory changes. Spleen: Normal in size without focal abnormality. Adrenals/Urinary Tract: Both adrenal glands appear normal. Small 4 mm renal calculus seen in the lower pole the right kidney. No hydronephrosis. No left-sided renal calculi seen. Bladder is unremarkable. Stomach/Bowel: The stomach, small bowel, and colon are normal in appearance. No inflammatory changes, wall thickening, or obstructive findings.Scattered colonic diverticula are noted without diverticulitis. The appendix is normal in appearance. Vascular/Lymphatic: There are no enlarged mesenteric, retroperitoneal, or pelvic lymph nodes. No significant vascular findings are present. Reproductive: The prostate is unremarkable. Other: No evidence of abdominal wall mass or hernia. Musculoskeletal: No acute or significant osseous findings. IMPRESSION: 1. Hepatic steatosis. 2. Diverticulosis without diverticulitis. 3. Nonobstructing 4 mm right renal calculi. Electronically Signed   By: Prudencio Pair M.D.   On: 07/30/2019 20:27     Assessment: Upper abdominal pain - Plan: CBC w/Diff, Comp Met (CMET), Ambulatory referral to  Gastroenterology  Abdominal bloating - Plan: CBC w/Diff, Comp Met (CMET), Ambulatory referral to Gastroenterology  Altered bowel habits - Plan: CBC w/Diff, Comp Met (CMET), Ambulatory referral to Gastroenterology  History of Helicobacter pylori infection - Plan: CBC w/Diff, Comp Met (CMET), Ambulatory referral to Gastroenterology  Esophageal dysphagia - Plan: CBC w/Diff, Comp Met (CMET), Ambulatory referral to Gastroenterology   Several years of upper abdominal pain with a burning bloating quality, associated intermittent sharp left upper quadrant pain.  He feels the pain is more constant within the last month.  He looks well, and other than intermittent dysphagia, does not have red flag signs such as nausea vomiting loss of appetite or weight loss. Bloating and altered bowel habits suggestive of IBS, less likely SIBO without risk factors.  No prior celiac testing.  Unknown if H. pylori eradication was confirmed.  I feel he needs endoscopic evaluation with EGD and colonoscopy.  He was agreeable to doing so and required fee disclosures were given for self-pay patient.  Plan:  CBC CMP Colonoscopy Endoscopy Start Dicyclomine 10 mg twice daily prn  Thank you for the courtesy of this consult.  Please call me with any questions or concerns.  Carleene Cooper III, MD, have reviewed all documentation for this visit. The documentation on 09/03/22 for the exam, diagnosis, procedures, and orders are all accurate and complete.  CC: Referring provider noted above   I,Alexis Herring,acting as a scribe for Wilmore, MD.,have documented all relevant documentation on the behalf of Doran Stabler, MD,as directed by  Doran Stabler, MD while in the presence of Doran Stabler, MD.

## 2022-09-03 NOTE — Progress Notes (Deleted)
    [facility] Consult Note:  History: MIKYLE SOX 09/03/2022  Referring provider: Patient, No Pcp Per  Reason for consult/chief complaint: No chief complaint on file.   Subjective  HPI: Merwyn is a 47 year old man referred by primary care for evaluation of "GERD".  Referral notes indicate patient has had several years of dyspepsia, left upper quadrant pain and bloating, prior H. pylori treatment.  Persistent symptoms despite PPI and H2 blocker. ***   ROS:  Review of Systems   Past Medical History: No past medical history on file.   Past Surgical History: No past surgical history on file.   Family History: No family history on file.  Social History: Social History   Socioeconomic History   Marital status: Married    Spouse name: Not on file   Number of children: Not on file   Years of education: Not on file   Highest education level: Not on file  Occupational History   Not on file  Tobacco Use   Smoking status: Never   Smokeless tobacco: Never  Substance and Sexual Activity   Alcohol use: Yes   Drug use: Not Currently   Sexual activity: Not on file  Other Topics Concern   Not on file  Social History Narrative   Not on file   Social Determinants of Health   Financial Resource Strain: Not on file  Food Insecurity: Not on file  Transportation Needs: Not on file  Physical Activity: Not on file  Stress: Not on file  Social Connections: Not on file    Allergies: Not on File  Outpatient Meds: Current Outpatient Medications  Medication Sig Dispense Refill   ibuprofen (ADVIL,MOTRIN) 800 MG tablet Take 1 tablet (800 mg total) by mouth every 8 (eight) hours as needed. 21 tablet 0   loratadine (CLARITIN) 10 MG tablet Take 10 mg by mouth daily as needed for allergies.     No current facility-administered medications for this visit.      ___________________________________________________________________ Objective    Exam:  There were no vitals taken for this visit. Wt Readings from Last 3 Encounters:  11/21/17 160 lb (72.6 kg)    General: ***  Eyes: sclera anicteric, no redness ENT: oral mucosa moist without lesions, no cervical or supraclavicular lymphadenopathy CV: ***, no JVD, no peripheral edema Resp: clear to auscultation bilaterally, normal RR and effort noted GI: soft, *** tenderness, with active bowel sounds. No guarding or palpable organomegaly noted. Skin; warm and dry, no rash or jaundice noted Neuro: awake, alert and oriented x 3. Normal gross motor function and fluent speech  Labs:  ***  Radiologic Studies:  ***  Assessment: No diagnosis found.  ***  Plan:  ***  Thank you for the courtesy of this consult.  Please call me with any questions or concerns.  Nelida Meuse III  CC: Referring provider noted above

## 2022-09-03 NOTE — Patient Instructions (Addendum)
_______________________________________________________  If your blood pressure at your visit was 140/90 or greater, please contact your primary care physician to follow up on this.  _______________________________________________________  If you are age 46 or older, your body mass index should be between 23-30. Your Body mass index is 28.79 kg/m. If this is out of the aforementioned range listed, please consider follow up with your Primary Care Provider.  If you are age 47 or younger, your body mass index should be between 19-25. Your Body mass index is 28.79 kg/m. If this is out of the aformentioned range listed, please consider follow up with your Primary Care Provider.   ________________________________________________________  The [facility] providers would like to encourage you to use [facility] to communicate with providers for non-urgent requests or questions.  Due to long hold times on the telephone, sending your provider a message by [facility] may be a faster and more efficient way to get a response.  Please allow 48 business hours for a response.  Please remember that this is for non-urgent requests.  _______________________________________________________  Your provider has requested that you go to the basement level for lab work before leaving today. Press "B" on the elevator. The lab is located at the first door on the left as you exit the elevator.  You have been scheduled for a colonoscopy. Please follow written instructions given to you at your visit today.  Please pick up your prep supplies at the pharmacy within the next 1-3 days. If you use inhalers (even only as needed), please bring them with you on the day of your procedure.

## 2022-09-23 ENCOUNTER — Encounter: Payer: Self-pay | Admitting: Gastroenterology

## 2022-10-08 ENCOUNTER — Ambulatory Visit (AMBULATORY_SURGERY_CENTER): Payer: Self-pay | Admitting: Gastroenterology

## 2022-10-08 ENCOUNTER — Encounter: Payer: Self-pay | Admitting: Gastroenterology

## 2022-10-08 VITALS — BP 116/72 | HR 51 | Temp 98.3°F | Resp 21 | Ht 65.0 in | Wt 173.0 lb

## 2022-10-08 DIAGNOSIS — K2970 Gastritis, unspecified, without bleeding: Secondary | ICD-10-CM

## 2022-10-08 DIAGNOSIS — K529 Noninfective gastroenteritis and colitis, unspecified: Secondary | ICD-10-CM

## 2022-10-08 DIAGNOSIS — R194 Change in bowel habit: Secondary | ICD-10-CM

## 2022-10-08 DIAGNOSIS — R1010 Upper abdominal pain, unspecified: Secondary | ICD-10-CM

## 2022-10-08 MED ORDER — SODIUM CHLORIDE 0.9 % IV SOLN: 500.0000 mL | Freq: Once | INTRAVENOUS | Status: DC

## 2022-10-08 NOTE — Op Note (Signed)
[facility] Patient Name: Raymond Olson Procedure Date: 10/08/2022 2:42 PM MRN: [id number] Endoscopist: Mallie Mussel L. Loletha Carrow , MD, [id number] Age: 47 Referring MD:  Date of Birth: Oct 06, 1975 Gender: Male Account #: [id number] Procedure:                Colonoscopy Indications:              Chronic diarrhea, bloating, LUQ pain Medicines:                Monitored Anesthesia Care Procedure:                Pre-Anesthesia Assessment:                           - Prior to the procedure, a History and Physical                            was performed, and patient medications and                            allergies were reviewed. The patient's tolerance of                            previous anesthesia was also reviewed. The risks                            and benefits of the procedure and the sedation                            options and risks were discussed with the patient.                            All questions were answered, and informed consent                            was obtained. Prior Anticoagulants: The patient has                            taken no anticoagulant or antiplatelet agents. ASA                            Grade Assessment: II - A patient with mild systemic                            disease. After reviewing the risks and benefits,                            the patient was deemed in satisfactory condition to                            undergo the procedure.                           After obtaining informed consent, the colonoscope  was passed under direct vision. Throughout the                            procedure, the patient's blood pressure, pulse, and                            oxygen saturations were monitored continuously. The                            Olympus CF-[id number] ([id number]) Colonoscope was                            introduced through the anus and advanced to the the                            terminal ileum,  with identification of the                            appendiceal orifice and IC valve. The colonoscopy                            was performed without difficulty. The patient                            tolerated the procedure well. The quality of the                            bowel preparation was excellent. The terminal                            ileum, ileocecal valve, appendiceal orifice, and                            rectum were photographed. Scope In: 2:54:01 PM Scope Out: 3:04:44 PM Scope Withdrawal Time: 0 hours 8 minutes 28 seconds  Total Procedure Duration: 0 hours 10 minutes 43 seconds  Findings:                 The perianal and digital rectal examinations were                            normal.                           The terminal ileum appeared normal.                           Repeat examination of right colon under NBI                            performed.                           Multiple diverticula were found in the left colon  and right colon.                           Normal mucosa was found in the entire colon.                            Biopsies for histology were taken with a cold                            forceps from the right colon and left colon for                            evaluation of microscopic colitis.                           There is no endoscopic evidence of polyps in the                            entire colon.                           Internal hemorrhoids were found. The hemorrhoids                            were small.                           The exam was otherwise without abnormality on                            direct and retroflexion views. Complications:            No immediate complications. Estimated Blood Loss:     Estimated blood loss was minimal. Impression:               - The examined portion of the ileum was normal.                           - Diverticulosis in the left colon and in the right                             colon.                           - Normal mucosa in the entire examined colon.                            Biopsied.                           - Internal hemorrhoids.                           - The examination was otherwise normal on direct                            and retroflexion views. Recommendation:           -  Patient has a contact number available for                            emergencies. The signs and symptoms of potential                            delayed complications were discussed with the                            patient. Return to normal activities tomorrow.                            Written discharge instructions were provided to the                            patient.                           - Resume previous diet.                           - Continue present medications.                           - Await pathology results.                           - See the other procedure note for documentation of                            additional recommendations.                           - Repeat colonoscopy in 10 years for screening                            purposes. Dashon Mcintire L. Loletha Carrow, MD 10/08/2022 3:09:16 PM This report has been signed electronically.

## 2022-10-08 NOTE — Op Note (Signed)
[facility] Patient Name: Briant Arif Procedure Date: 10/08/2022 2:42 PM MRN: [id number] Endoscopist: Mallie Mussel L. Loletha Carrow , MD, [id number] Age: 47 Referring MD:  Date of Birth: 29-Dec-1975 Gender: Male Account #: [id number] Procedure:                Upper GI endoscopy Indications:              Upper abdominal pain Medicines:                Monitored Anesthesia Care Procedure:                Pre-Anesthesia Assessment:                           - Prior to the procedure, a History and Physical                            was performed, and patient medications and                            allergies were reviewed. The patient's tolerance of                            previous anesthesia was also reviewed. The risks                            and benefits of the procedure and the sedation                            options and risks were discussed with the patient.                            All questions were answered, and informed consent                            was obtained. Prior Anticoagulants: The patient has                            taken no anticoagulant or antiplatelet agents. ASA                            Grade Assessment: II - A patient with mild systemic                            disease. After reviewing the risks and benefits,                            the patient was deemed in satisfactory condition to                            undergo the procedure.                           After obtaining informed consent, the endoscope was  passed under direct vision. Throughout the                            procedure, the patient's blood pressure, pulse, and                            oxygen saturations were monitored continuously. The                            GIF [id number] was introduced through the                            mouth, and advanced to the second part of duodenum.                            The upper GI endoscopy was  accomplished without                            difficulty. The patient tolerated the procedure                            well. Scope In: Scope Out: Findings:                 The esophagus was normal.                           Patchy inflammation characterized by adherent                            blood, erosions and erythema was found in the                            gastric antrum. Several biopsies were obtained on                            the greater curvature of the gastric body, on the                            lesser curvature of the gastric body, on the                            greater curvature of the gastric antrum and on the                            lesser curvature of the gastric antrum with cold                            forceps for histology.                           The exam of the stomach was otherwise normal.                           The cardia and gastric  fundus were normal on                            retroflexion.                           Normal mucosa was found in the entire duodenum.                            Biopsies for histology were taken with a cold                            forceps for evaluation of celiac disease. Complications:            No immediate complications. Estimated Blood Loss:     Estimated blood loss was minimal. Impression:               - Normal esophagus.                           - Gastritis.                           - Normal mucosa was found in the entire examined                            duodenum. Biopsied.                           - Several biopsies were obtained on the greater                            curvature of the gastric body, on the lesser                            curvature of the gastric body, on the greater                            curvature of the gastric antrum and on the lesser                            curvature of the gastric antrum. Recommendation:           - Patient has a contact number  available for                            emergencies. The signs and symptoms of potential                            delayed complications were discussed with the                            patient. Return to normal activities tomorrow.                            Written discharge instructions were provided to the  patient.                           - Resume previous diet.                           - Continue present medications.                           - Await pathology results.                           - See the other procedure note for documentation of                            additional recommendations. Coraima Tibbs L. Loletha Carrow, MD 10/08/2022 2:52:30 PM This report has been signed electronically.

## 2022-10-08 NOTE — Progress Notes (Signed)
History and Physical:  This patient presents for endoscopic testing for: Encounter Diagnoses  Name Primary?   Upper abdominal pain Yes   Altered bowel habits     Clinical details in 09/03/22 office consult note. Spanish interpreter present for pre-procedure evaluation  Patient is otherwise without complaints or active issues today.   Past Medical History: Past Medical History:  Diagnosis Date   Acid reflux    H. pylori infection    Hypertension      Past Surgical History: History reviewed. No pertinent surgical history.  Allergies: Not on File  Outpatient Meds: Current Outpatient Medications  Medication Sig Dispense Refill   famotidine (PEPCID) 20 MG tablet Take 20 mg by mouth 2 (two) times daily.     ibuprofen (ADVIL,MOTRIN) 800 MG tablet Take 1 tablet (800 mg total) by mouth every 8 (eight) hours as needed. (Patient not taking: Reported on 09/03/2022) 21 tablet 0   loratadine (CLARITIN) 10 MG tablet Take 10 mg by mouth daily as needed for allergies. (Patient not taking: Reported on 09/03/2022)     Current Facility-Administered Medications  Medication Dose Route Frequency Provider Last Rate Last Admin   0.9 %  sodium chloride infusion  500 mL Intravenous Once Danis, Kirke Corin, MD          ___________________________________________________________________ Objective   Exam:  BP (!) 139/93   Pulse 66   Temp 98.3 F (36.8 C) (Skin)   Ht 5\' 5"  (1.651 m)   Wt 173 lb (78.5 kg)   SpO2 99%   BMI 28.79 kg/m   CV: regular , S1/S2 Resp: clear to auscultation bilaterally, normal RR and effort noted GI: soft, no tenderness, with active bowel sounds.   Assessment: Encounter Diagnoses  Name Primary?   Upper abdominal pain Yes   Altered bowel habits      Plan: Colonoscopy EGD  The benefits and risks of the planned procedure were described in detail with the patient or (when appropriate) their health care proxy.  Risks were outlined as including, but not  limited to, bleeding, infection, perforation, adverse medication reaction leading to cardiac or pulmonary decompensation, pancreatitis (if ERCP).  The limitation of incomplete mucosal visualization was also discussed.  No guarantees or warranties were given.    The patient is appropriate for an endoscopic procedure in the ambulatory setting.   - Wilfrid Lund, MD

## 2022-10-08 NOTE — Progress Notes (Signed)
Sedate, gd SR's, VSS, report to RN 

## 2022-10-08 NOTE — Progress Notes (Signed)
VS by CW  Pt's states no medical or surgical changes since previsit or office visit.   Interpreter used today at the [facility] for this pt.  Interpreter's name is- Christia Reading

## 2022-10-08 NOTE — Patient Instructions (Addendum)
- Resume previous diet. - Continue present medications. - Await pathology results. - See the other procedure note for documentation of additional recommendations. - Repeat colonoscopy in 10 years for screening purposes.  YOU HAD AN ENDOSCOPIC PROCEDURE TODAY AT [facility]:   Refer to the procedure report that was given to you for any specific questions about what was found during the examination.  If the procedure report does not answer your questions, please call your gastroenterologist to clarify.  If you requested that your care partner not be given the details of your procedure findings, then the procedure report has been included in a sealed envelope for you to review at your convenience later.  YOU SHOULD EXPECT: Some feelings of bloating in the abdomen. Passage of more gas than usual.  Walking can help get rid of the air that was put into your GI tract during the procedure and reduce the bloating. If you had a lower endoscopy (such as a colonoscopy or flexible sigmoidoscopy) you may notice spotting of blood in your stool or on the toilet paper. If you underwent a bowel prep for your procedure, you may not have a normal bowel movement for a few days.  Please Note:  You might notice some irritation and congestion in your nose or some drainage.  This is from the oxygen used during your procedure.  There is no need for concern and it should clear up in a day or so.  SYMPTOMS TO REPORT IMMEDIATELY:  Following lower endoscopy (colonoscopy or flexible sigmoidoscopy):  Excessive amounts of blood in the stool  Significant tenderness or worsening of abdominal pains  Swelling of the abdomen that is new, acute  Fever of 100F or higher  Following upper endoscopy (EGD)  Vomiting of blood or coffee ground material  New chest pain or pain under the shoulder blades  Painful or persistently difficult swallowing  New shortness of breath  Fever of 100F or higher  Black, tarry-looking  stools  For urgent or emergent issues, a gastroenterologist can be reached at any hour by calling [phone]. Do not use MyChart messaging for urgent concerns.    DIET:  We do recommend a small meal at first, but then you may proceed to your regular diet.  Drink plenty of fluids but you should avoid alcoholic beverages for 24 hours.  ACTIVITY:  You should plan to take it easy for the rest of today and you should NOT DRIVE or use heavy machinery until tomorrow (because of the sedation medicines used during the test).    FOLLOW UP: Our staff will call the number listed on your records the next business day following your procedure.  We will call around 7:15- 8:00 am to check on you and address any questions or concerns that you may have regarding the information given to you following your procedure. If we do not reach you, we will leave a message.     If any biopsies were taken you will be contacted by phone or by letter within the next 1-3 weeks.  Please call us at [phone] if you have not heard about the biopsies in 3 weeks.    SIGNATURES/CONFIDENTIALITY: You and/or your care partner have signed paperwork which will be entered into your electronic medical record.  These signatures attest to the fact that that the information above on your After Visit Summary has been reviewed and is understood.  Full responsibility of the confidentiality of this discharge information lies with you and/or  your care-partner. 

## 2022-10-09 ENCOUNTER — Telehealth: Payer: Self-pay | Admitting: *Deleted

## 2022-10-09 NOTE — Telephone Encounter (Signed)
  Follow up Call-     10/08/2022    1:39 PM  Call back number  Post procedure Call Back phone  # [phone]  Permission to leave phone message Yes     Patient questions:  Do you have a fever, pain , or abdominal swelling? No. Pain Score  0 *  Have you tolerated food without any problems? Yes.    Have you been able to return to your normal activities? Yes.    Do you have any questions about your discharge instructions: Diet   No. Medications  No. Follow up visit  No.  Do you have questions or concerns about your Care? No.  Actions: * If pain score is 4 or above: No action needed, pain <4.

## 2022-10-16 ENCOUNTER — Encounter: Payer: Self-pay | Admitting: Gastroenterology

## 2023-12-04 ENCOUNTER — Telehealth: Payer: Self-pay

## 2023-12-04 NOTE — Telephone Encounter (Signed)
 Patient's appointment was rescheduled. Patient would like for his new pt appointment to be sooner .  We will call patient if there is a cancellation.

## 2023-12-08 ENCOUNTER — Ambulatory Visit: Payer: Self-pay | Admitting: Internal Medicine

## 2023-12-21 NOTE — Telephone Encounter (Signed)
 Called patient , patient did not take due to work.

## 2024-03-11 NOTE — Telephone Encounter (Signed)
 Patient has been scheduled

## 2024-03-15 ENCOUNTER — Ambulatory Visit: Payer: Self-pay | Admitting: Internal Medicine

## 2024-03-15 ENCOUNTER — Encounter: Payer: Self-pay | Admitting: Internal Medicine

## 2024-03-15 VITALS — BP 150/90 | HR 83 | Resp 18 | Ht 63.0 in | Wt 158.0 lb

## 2024-03-15 DIAGNOSIS — M546 Pain in thoracic spine: Secondary | ICD-10-CM

## 2024-03-15 DIAGNOSIS — R011 Cardiac murmur, unspecified: Secondary | ICD-10-CM

## 2024-03-15 DIAGNOSIS — H532 Diplopia: Secondary | ICD-10-CM

## 2024-03-15 DIAGNOSIS — G8929 Other chronic pain: Secondary | ICD-10-CM

## 2024-03-15 DIAGNOSIS — H538 Other visual disturbances: Secondary | ICD-10-CM

## 2024-03-15 DIAGNOSIS — K219 Gastro-esophageal reflux disease without esophagitis: Secondary | ICD-10-CM

## 2024-03-15 DIAGNOSIS — M542 Cervicalgia: Secondary | ICD-10-CM

## 2024-03-15 DIAGNOSIS — Z13220 Encounter for screening for lipoid disorders: Secondary | ICD-10-CM

## 2024-03-15 DIAGNOSIS — R200 Anesthesia of skin: Secondary | ICD-10-CM

## 2024-03-15 DIAGNOSIS — R030 Elevated blood-pressure reading, without diagnosis of hypertension: Secondary | ICD-10-CM

## 2024-03-15 MED ORDER — CARVEDILOL 3.125 MG PO TABS: 3.1250 mg | ORAL_TABLET | Freq: Two times a day (BID) | ORAL | 11 refills | Status: DC

## 2024-03-15 MED ORDER — IBUPROFEN 200 MG PO TABS: ORAL_TABLET | ORAL | 0 refills | Status: AC

## 2024-03-15 NOTE — Progress Notes (Signed)
 "   Subjective:    Patient ID: IBAN UTZ, male   DOB: [birth year], 48 y.o.   MRN: [id number]   HPI  Here to establish Raymond Olson interprets   He is concerned he is having symptoms of Multiple sclerosis:    Last year was having a lot of abdominal discomfort.  His abdominal pain has resolved, he is just taking medication now for GERD.  Famotidine controls his symptoms for the lattter.  For past year, he has had neck and upper back pain.  No history of injury.  He is not aware of any activity that exacerbates the pain.  He performs maintenance at a hotel.   States the pain comes and goes.  The first time occurred, he was at work, sweeping or mopping.  Can occur at rest or with movement.   Can also have entire back feel like it is burning as if he has hot sauce on his bare back.    Having numbness and tingling in his arms on awakening, but not associated with the neck and back pain.  He can have the pain, but more often than not, does not have pain at same time.  States both hands and arms are numb and tingly up to shoulder.  This has also been going on for a year and only occurs upon awakening.   If he moves the arms around, the numbness resolves.   Blurry vision:  started in 2023.  Has been gradually worsening.  Last eye check also in 2023.  He has not had new glasses since then as well.  He wears corrective lenses for astigmatism.  He at times has double vision.   He has not tried covering one of his eyes.  Can have double vision once to twice weekly.  Can last 1-2 hours when occurs.    Having little muscle spasms in his arms and legs.  Generally notes more when at rest.    Can have sharp, stabbing pain in muscles--arms, legs, fingers.  Happens only in once place on his body at a time.    He feels his grandmother died from MS that was undiagnosed.  Had numbness on hands and legs and would get dizzy a lot.    States he never brought up these symptoms to his provider at  [facility], where he was previously seen as a patient.    2.  Elevated BP:  has never been on medication to control BP>  Current Meds  Medication Sig   famotidine (PEPCID) 20 MG tablet Take 20 mg by mouth 2 (two) times daily. (Patient taking differently: Take 10 mg by mouth every 3 (three) days.)     No Known Allergies  Past Medical History:  Diagnosis Date   Acid reflux    H. pylori infection    Hypertension    History reviewed. No pertinent surgical history. Family History  Problem Relation Age of Onset   Stroke Mother    Hypertension Mother    Hypertension Father    Diabetes Father    Stroke Father        brain   Diabetes Sister    Diabetes Brother    Heart disease Maternal Grandmother    Stomach cancer Cousin 65   Esophageal cancer Neg Hx    Colon cancer Neg Hx    Social History   Socioeconomic History   Marital status: Divorced    Spouse name: Not on file   Number of children: 0  Years of education: Not on file   Highest education level: Not on file  Occupational History   Occupation: programmer, applications  Tobacco Use   Smoking status: Never   Smokeless tobacco: Never  Vaping Use   Vaping status: Never Used  Substance and Sexual Activity   Alcohol use: Yes    Comment: 6 beers in one sitting once weekly   Drug use: Not Currently   Sexual activity: Not on file  Other Topics Concern   Not on file  Social History Narrative   LIves with his sister   Social Drivers of Health   Financial Resource Strain: Low Risk  (03/15/2024)   Overall Financial Resource Strain (CARDIA)    Difficulty of Paying Living Expenses: Not hard at all  Food Insecurity: No Food Insecurity (03/15/2024)   Hunger Vital Sign    Worried About Running Out of Food in the Last Year: Never true    Ran Out of Food in the Last Year: Never true  Transportation Needs: No Transportation Needs (03/15/2024)   PRAPARE - Administrator, Civil Service (Medical): No    Lack of Transportation  (Non-Medical): No  Physical Activity: Not on file  Stress: Not on file  Social Connections: Not on file  Intimate Partner Violence: Not on file      Review of Systems  Endocrine:       Night sweats FOR 3 years.  Only 2-3 times yearlgy.   Genitourinary:        No incontinence.  Neurological:        No gait abnormality.        Objective:   BP (!) 150/90 (BP Location: Left Arm, Patient Position: Sitting, Cuff Size: Normal)   Pulse 66   Resp 18   Ht 5' 3 (1.6 m)   Wt 158 lb (71.7 kg)   BMI 27.99 kg/m   Repeat HR 83  Physical Exam HENT:     Head: Normocephalic and atraumatic.     Right Ear: Tympanic membrane, ear canal and external ear normal.     Left Ear: Tympanic membrane, ear canal and external ear normal.     Nose: Nose normal.     Mouth/Throat:     Mouth: Mucous membranes are moist.     Pharynx: Oropharynx is clear.  Eyes:     Extraocular Movements: Extraocular movements intact.     Conjunctiva/sclera: Conjunctivae normal.     Pupils: Pupils are equal, round, and reactive to light.     Comments: Discs sharp Unable to elicit double vision today  Neck:     Thyroid: No thyroid mass or thyromegaly.  Cardiovascular:     Rate and Rhythm: Normal rate and regular rhythm.     Heart sounds:     No S3 or S4 sounds.     Comments: Hyperdynamic heart with louder HS, though no hyperdynamic pulses.  Possible opening snap and systolic murmur--heard best at axilla and at LSB.  No carotid bruits.  Carotid, radial, femoral, DP and PT pulses normal and equal.        Pulmonary:     Effort: Pulmonary effort is normal.     Breath sounds: Normal breath sounds and air entry.  Abdominal:     General: Bowel sounds are normal.     Palpations: Abdomen is soft. There is no mass.     Tenderness: There is no abdominal tenderness.     Hernia: No hernia is present.  Musculoskeletal:  General: Normal range of motion.     Cervical back: Normal range of motion and neck supple.      Right lower leg: No edema.     Left lower leg: No edema.  Lymphadenopathy:     Head:     Right side of head: No submental or submandibular adenopathy.     Left side of head: No submental or submandibular adenopathy.     Cervical: No cervical adenopathy.     Upper Body:     Right upper body: No supraclavicular adenopathy.     Left upper body: No supraclavicular adenopathy.     Lower Body: No right inguinal adenopathy. No left inguinal adenopathy.  Skin:    General: Skin is warm.     Capillary Refill: Capillary refill takes less than 2 seconds.     Findings: No rash.  Neurological:     General: No focal deficit present.     Mental Status: He is alert and oriented to person, place, and time.     Cranial Nerves: Cranial nerves 2-12 are intact.     Sensory: Sensation is intact.     Motor: Motor function is intact.     Coordination: Coordination is intact.     Gait: Gait is intact.     Deep Tendon Reflexes: Reflexes are normal and symmetric.     Comments: + Tinels at medial elbow bilaterally + tinels and phalens over ventral wrists bilaterally.  Psychiatric:        Mood and Affect: Mood normal.        Speech: Speech normal.        Behavior: Behavior normal. Behavior is cooperative.       Assessment & Plan   Constellation of symptoms that may be unrelated, but patient concern for MS:  Discussed not ruling out need for MS evaluation, but that his symptoms could also be explained by unrelated issues and would like to pursue those initially while he obtains [facility] orange card.   See following issues:    2.  Bilateral hand and arm numbness:  appears to have ulnar nerve compression at medial elbow and likely bilateral carpal tunnel as well.  Would like for him to take Ibuprofen  600 mg twice daily with food for 2 weeks and obtain elbow pads and cock up splints for carpal tunnel syndrome and see if improves over next 2-3 months.    3.  Back Pain and burning sensation of back:  not clear  what sets this off.  Ibuprofen  as above. To pay attention to activities that may exacerbate.  Referral to PT in [facility].  4.  Blurry vision and double vision:  no findings today on exam.  He will return to his vision provider for reevaluation and perhaps new glasses.  Not clear if double vision is part of blurriness or if true diplopia.    5.  Hyperdynamic heart exam, elevated BP and murmur, not clear if Mitral regurg/MVP:  ECG and referral to cardiology for evaluation and echo.  He does admit at times his heart beats fast, but no light headedness.  No chest pain, but can have shortness of breath when climbing stairs.  Labs as above.  His pulses do not feel hyperdynamic.  Also, screen for hypercholesterolemia.  ECG with inferior q waves, not clear how important these are as low voltage.  Appears to have prominent P waves in arm leads.  No obvious biphasic p waves anteriorly.   Carvedilol  3.125 twice daily.  Will need to apply for [facility] Financial assistance for cardiac evaluation. "

## 2024-03-16 LAB — COMPREHENSIVE METABOLIC PANEL WITH GFR
ALT: 25 IU/L (ref 0–44)
AST: 20 IU/L (ref 0–40)
Albumin: 4.9 g/dL (ref 4.1–5.1)
Alkaline Phosphatase: 93 IU/L (ref 44–121)
BUN/Creatinine Ratio: 23 — ABNORMAL HIGH (ref 9–20)
BUN: 17 mg/dL (ref 6–24)
Bilirubin Total: 0.5 mg/dL (ref 0.0–1.2)
CO2: 24 mmol/L (ref 20–29)
Calcium: 10 mg/dL (ref 8.7–10.2)
Chloride: 100 mmol/L (ref 96–106)
Creatinine, Ser: 0.75 mg/dL — ABNORMAL LOW (ref 0.76–1.27)
Globulin, Total: 2.6 g/dL (ref 1.5–4.5)
Glucose: 98 mg/dL (ref 70–99)
Potassium: 4.4 mmol/L (ref 3.5–5.2)
Sodium: 140 mmol/L (ref 134–144)
Total Protein: 7.5 g/dL (ref 6.0–8.5)
eGFR: 112 mL/min/1.73 (ref >59)

## 2024-03-16 LAB — CBC WITH DIFFERENTIAL/PLATELET
Basophils Absolute: 0 x10E3/uL (ref 0.0–0.2)
Basos: 1 %
EOS (ABSOLUTE): 0 x10E3/uL (ref 0.0–0.4)
Eos: 0 %
Hematocrit: 50.5 % (ref 37.5–51.0)
Hemoglobin: 17.1 g/dL (ref 13.0–17.7)
Immature Grans (Abs): 0 x10E3/uL (ref 0.0–0.1)
Immature Granulocytes: 0 %
Lymphocytes Absolute: 0.9 x10E3/uL (ref 0.7–3.1)
Lymphs: 15 %
MCH: 30.1 pg (ref 26.6–33.0)
MCHC: 33.9 g/dL (ref 31.5–35.7)
MCV: 89 fL (ref 79–97)
Monocytes Absolute: 0.4 x10E3/uL (ref 0.1–0.9)
Monocytes: 7 %
Neutrophils Absolute: 4.8 x10E3/uL (ref 1.4–7.0)
Neutrophils: 76 %
Platelets: 273 x10E3/uL (ref 150–450)
RBC: 5.68 x10E6/uL (ref 4.14–5.80)
RDW: 13.7 % (ref 11.6–15.4)
WBC: 6.2 x10E3/uL (ref 3.4–10.8)

## 2024-03-16 LAB — LIPID PANEL W/O CHOL/HDL RATIO
Cholesterol, Total: 251 mg/dL — ABNORMAL HIGH (ref 100–199)
HDL: 44 mg/dL (ref >39)
LDL Chol Calc (NIH): 133 mg/dL — ABNORMAL HIGH (ref 0–99)
Triglycerides: 409 mg/dL — ABNORMAL HIGH (ref 0–149)
VLDL Cholesterol Cal: 74 mg/dL — ABNORMAL HIGH (ref 5–40)

## 2024-03-16 LAB — TSH: TSH: 0.932 u[IU]/mL (ref 0.450–4.500)

## 2024-03-29 ENCOUNTER — Ambulatory Visit: Payer: Self-pay

## 2024-04-19 ENCOUNTER — Ambulatory Visit: Payer: Self-pay | Admitting: Internal Medicine

## 2024-05-21 ENCOUNTER — Ambulatory Visit: Payer: Self-pay | Admitting: Internal Medicine

## 2024-05-21 MED ORDER — SIMVASTATIN 40 MG PO TABS
40.0000 mg | ORAL_TABLET | Freq: Every day | ORAL | 11 refills | Status: AC
Start: 2024-05-21 — End: ?

## 2024-06-20 ENCOUNTER — Encounter: Payer: Self-pay | Admitting: Internal Medicine

## 2024-06-20 ENCOUNTER — Ambulatory Visit: Payer: Self-pay | Admitting: Internal Medicine

## 2024-06-20 VITALS — BP 150/94 | HR 85 | Resp 14 | Ht 65.0 in | Wt 172.0 lb

## 2024-06-20 DIAGNOSIS — G8929 Other chronic pain: Secondary | ICD-10-CM

## 2024-06-20 DIAGNOSIS — E782 Mixed hyperlipidemia: Secondary | ICD-10-CM

## 2024-06-20 DIAGNOSIS — R200 Anesthesia of skin: Secondary | ICD-10-CM

## 2024-06-20 DIAGNOSIS — R002 Palpitations: Secondary | ICD-10-CM

## 2024-06-20 DIAGNOSIS — M546 Pain in thoracic spine: Secondary | ICD-10-CM

## 2024-06-20 DIAGNOSIS — Z23 Encounter for immunization: Secondary | ICD-10-CM

## 2024-06-20 DIAGNOSIS — H538 Other visual disturbances: Secondary | ICD-10-CM

## 2024-06-20 DIAGNOSIS — R011 Cardiac murmur, unspecified: Secondary | ICD-10-CM

## 2024-06-20 DIAGNOSIS — I10 Essential (primary) hypertension: Secondary | ICD-10-CM

## 2024-06-20 DIAGNOSIS — M542 Cervicalgia: Secondary | ICD-10-CM

## 2024-06-20 MED ORDER — CARVEDILOL 6.25 MG PO TABS: ORAL_TABLET | ORAL | 11 refills | Status: AC

## 2024-06-20 NOTE — Progress Notes (Signed)
 "   Subjective:    Patient ID: Raymond Olson, male   DOB: Nov 21, 1975, 48 y.o.   MRN: [id number]   HPI   Hypertension:  states he takes the Carvedilol  regularly and rarely misses.  His BP and pulse are actually about the same to slightly higher.  States when he runs once weekly at 3 miles, his bp is down to 130/85.  2.  Hyperdynamic heart with hypertension and murmur:  Cardiology tried to contact him for an appt end of September, but he screens calls.  Not clear he has voicemail set up--after checking his phone, clear it is not.  Some palpitations at times and dyspnea at times with climbing stairs.    3.  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  He did get bilateral cock up splints and elbow pads and also took the ibuprofen  for 2 weeks.  No improvement after utilizing splints and pads for 2 months.  PT also worked with him on this.  He also has chronic neck pain, which also has improved with PT.    4.  Back pain and burning sensation of back:  went to [facility] PT and the pain has resolved.    5.  Blurry vision:  has not  been back to vision provider.  His current glasses are about 47.47 years old.    6.  Hyperlipidemia: High cholesterol, trigs and borderline high LDL.  He is now taking Simvastatin  40 mg for 2 weeks.  Has appt to recheck on the 19th of December.    Current Meds  Medication Sig   carvedilol  (COREG ) 3.125 MG tablet Take 1 tablet (3.125 mg total) by mouth 2 (two) times daily with a meal.   famotidine (PEPCID) 20 MG tablet Take 20 mg by mouth 2 (two) times daily.   simvastatin  (ZOCOR ) 40 MG tablet Take 1 tablet (40 mg total) by mouth at bedtime.   No Known Allergies   Review of Systems    Objective:   BP (!) 150/94 (BP Location: Left Arm, Patient Position: Sitting, Cuff Size: Normal)   Pulse 85   Resp 14   Ht 5' 5 (1.651 m)   Wt 172 lb (78 kg)   BMI 28.62 kg/m   Physical Exam HENT:     Head:  Normocephalic and atraumatic.     Right Ear: Tympanic membrane normal.     Left Ear: Tympanic membrane normal.     Nose: Nose normal.     Mouth/Throat:     Mouth: Mucous membranes are moist.     Pharynx: Oropharynx is clear.  Eyes:     Extraocular Movements: Extraocular movements intact.     Conjunctiva/sclera: Conjunctivae normal.     Pupils: Pupils are equal, round, and reactive to light.     Comments: Discs sharp  Cardiovascular:     Rate and Rhythm: Normal rate and regular rhythm.     Pulses: Normal pulses.     Heart sounds: Murmur (systolic click with systolic murmur heard best at LSB and axilla remains.) heard.     No friction rub.  Pulmonary:     Effort: Pulmonary effort is normal.     Breath sounds: Normal breath sounds.  Abdominal:     General: Bowel sounds are normal.     Palpations: Abdomen is soft. There is no mass.     Tenderness: There is no abdominal tenderness.     Hernia: No hernia  is present.  Musculoskeletal:     Cervical back: Normal range of motion and neck supple.  Neurological:     Mental Status: He is alert.     Cranial Nerves: Cranial nerves 2-12 are intact.     Sensory: Sensation is intact.     Motor: Motor function is intact.     Coordination: Coordination is intact.     Gait: Gait is intact.     Deep Tendon Reflexes: Reflexes are normal and symmetric.     Comments: + tinels at elbow and negative at wrists--did not hold long enough for phalens at wrists.      Assessment & Plan   Hypertension:  not clear he is as consistent with carvedilol  as he thinks.  Increase dose to 6.25 mg twice daily.  BP check in 4 weeks.    2.  Heart murmur with hyperdynamic HS though pulses normal:  He will set up voicemail for calls and will contact cardiology for visit.    3.  Bilateral ulnar and median nerve compromise with chronic neck pain/thoracic back pain resolved with PT.  No improvement with NSAIDS, cock up splints and elbow pads with sleep.  With possible  history of diplopia at times, will refer to Neurology at [facility].  He is to get to his eye doctor for repeat evaluation of corrective lenses.    4.  Hyperlipidemia:  just on statin for 2 weeks.  Return for repeat labs in at least 2 more weeks.    5.  HM:  influenza vaccine.  Declined COVID.   "

## 2024-07-08 ENCOUNTER — Other Ambulatory Visit: Payer: Self-pay

## 2024-07-08 DIAGNOSIS — E782 Mixed hyperlipidemia: Secondary | ICD-10-CM

## 2024-07-08 DIAGNOSIS — Z79899 Other long term (current) drug therapy: Secondary | ICD-10-CM

## 2024-07-09 LAB — COMPREHENSIVE METABOLIC PANEL WITH GFR
ALT: 38 IU/L (ref 0–44)
AST: 27 IU/L (ref 0–40)
Albumin: 4.6 g/dL (ref 4.1–5.1)
Alkaline Phosphatase: 77 IU/L (ref 47–123)
BUN/Creatinine Ratio: 22 — ABNORMAL HIGH (ref 9–20)
BUN: 15 mg/dL (ref 6–24)
Bilirubin Total: 0.6 mg/dL (ref 0.0–1.2)
CO2: 24 mmol/L (ref 20–29)
Calcium: 9.1 mg/dL (ref 8.7–10.2)
Chloride: 100 mmol/L (ref 96–106)
Creatinine, Ser: 0.69 mg/dL — ABNORMAL LOW (ref 0.76–1.27)
Globulin, Total: 2.3 g/dL (ref 1.5–4.5)
Glucose: 118 mg/dL — ABNORMAL HIGH (ref 70–99)
Potassium: 4.5 mmol/L (ref 3.5–5.2)
Sodium: 138 mmol/L (ref 134–144)
Total Protein: 6.9 g/dL (ref 6.0–8.5)
eGFR: 115 mL/min/1.73

## 2024-07-09 LAB — LIPID PANEL W/O CHOL/HDL RATIO
Cholesterol, Total: 170 mg/dL (ref 100–199)
HDL: 43 mg/dL
LDL Chol Calc (NIH): 98 mg/dL (ref 0–99)
Triglycerides: 164 mg/dL — ABNORMAL HIGH (ref 0–149)
VLDL Cholesterol Cal: 29 mg/dL (ref 5–40)

## 2024-07-18 ENCOUNTER — Ambulatory Visit: Payer: Self-pay | Admitting: Internal Medicine

## 2024-07-29 NOTE — Progress Notes (Signed)
 Patient notified of lab results and has been scheduled for follow up labs and office visits.

## 2024-08-10 ENCOUNTER — Ambulatory Visit: Payer: Self-pay | Admitting: Internal Medicine

## 2024-08-10 VITALS — BP 140/90 | HR 55

## 2024-08-10 DIAGNOSIS — R200 Anesthesia of skin: Secondary | ICD-10-CM

## 2024-08-10 DIAGNOSIS — Z131 Encounter for screening for diabetes mellitus: Secondary | ICD-10-CM

## 2024-08-10 DIAGNOSIS — Z0130 Encounter for examination of blood pressure without abnormal findings: Secondary | ICD-10-CM

## 2024-08-10 NOTE — Progress Notes (Signed)
 140/90 pulse 55 the first check  The same on the second check   The patient takes Carvidolol 6.25 twice daily

## 2024-08-11 LAB — HEMOGLOBIN A1C
Est. average glucose Bld gHb Est-mCnc: 126 mg/dL
Hgb A1c MFr Bld: 6 % — ABNORMAL HIGH (ref 4.8–5.6)

## 2024-08-11 MED ORDER — LISINOPRIL 5 MG PO TABS: 5.0000 mg | ORAL_TABLET | Freq: Every day | ORAL | 11 refills | Status: AC

## 2024-08-11 NOTE — Progress Notes (Unsigned)
 Add Lisinopril  5 mg daiy BMP and BP check in 2 weeks

## 2024-08-12 NOTE — Progress Notes (Signed)
 Patient has been notified of medication changes and has been scheduled for bp check/bmp.

## 2024-08-26 ENCOUNTER — Ambulatory Visit: Payer: Self-pay

## 2024-08-26 VITALS — BP 136/88 | HR 56

## 2024-08-26 DIAGNOSIS — Z79899 Other long term (current) drug therapy: Secondary | ICD-10-CM

## 2024-08-26 DIAGNOSIS — Z0130 Encounter for examination of blood pressure without abnormal findings: Secondary | ICD-10-CM

## 2024-08-26 NOTE — Progress Notes (Signed)
 Patient reported that he is taking Lisinopril  daily and Carvedilol  BID consistently. Patient has no questions or concerns.

## 2024-11-30 ENCOUNTER — Other Ambulatory Visit: Payer: Self-pay

## 2024-12-06 ENCOUNTER — Ambulatory Visit: Payer: Self-pay | Admitting: Internal Medicine
